# Patient Record
Sex: Female | Born: 2000 | Race: White | Hispanic: No | Marital: Single | Smoking: Never smoker
Health system: Southern US, Community
[De-identification: ages and names within clinical notes are randomized; demographics above are authoritative.]

## PROBLEM LIST (undated history)

## (undated) DIAGNOSIS — F909 Attention-deficit hyperactivity disorder, unspecified type: Secondary | ICD-10-CM

## (undated) DIAGNOSIS — F419 Anxiety disorder, unspecified: Secondary | ICD-10-CM

## (undated) HISTORY — PX: TONSILLECTOMY: SUR1361

---
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
## 2019-01-31 IMAGING — CT CT MAXILLOFACIAL W/O CM
4 of 6 series · 16 of 47 positions shown, 18 images · non-contrast
Comparison: None.

CLINICAL DATA: 16-year-old female with trauma.

EXAM:
CT HEAD WITHOUT CONTRAST
CT MAXILLOFACIAL WITHOUT CONTRAST
TECHNIQUE: Multidetector CT imaging of the head and maxillofacial structures
were performed using the standard protocol without intravenous
contrast. Multiplanar CT image reconstructions of the maxillofacial
structures were also generated.

[Series 3: head wo · axial · 0.40mm/px · z∈[-97,-2]mm · 6 of 27 slices shown, 8 images]
[im 4/27  brain]
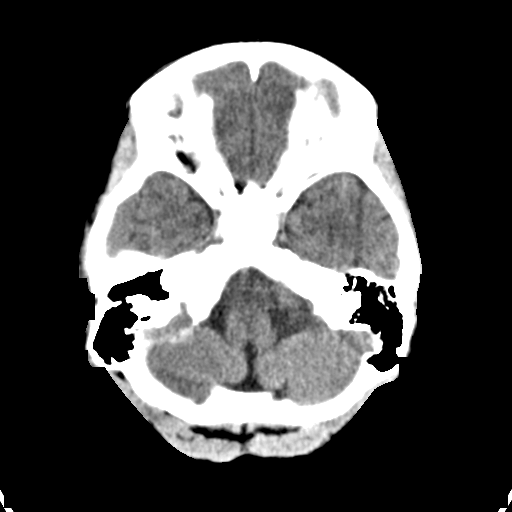
[im 4/27  bone]
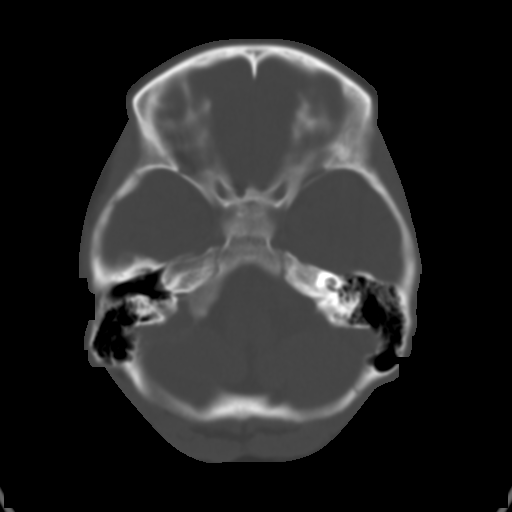
[im 8/27  bone]
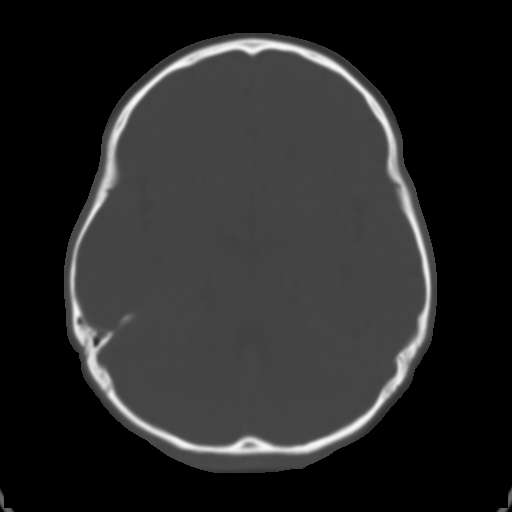
[im 12/27  bone]
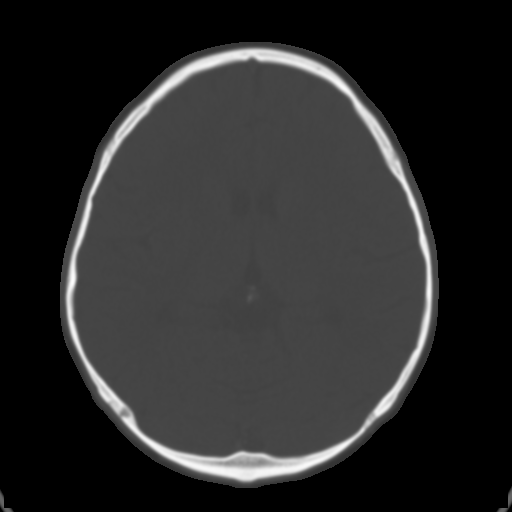
[im 15/27  bone]
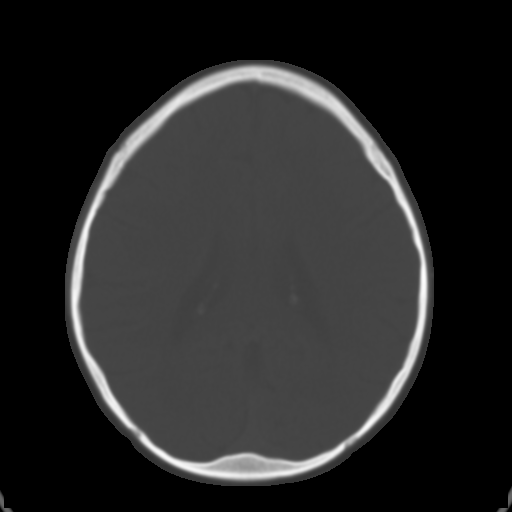
[im 19/27  brain]
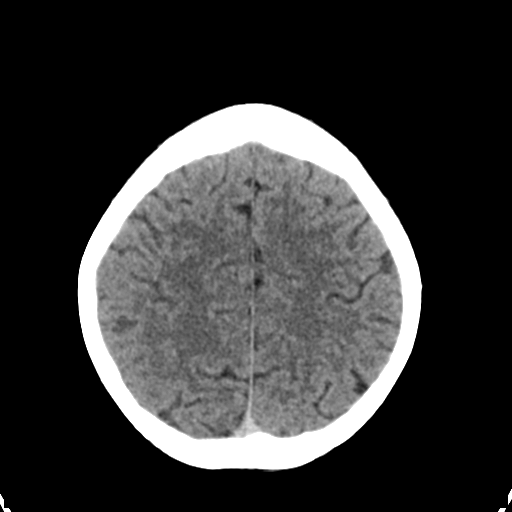
[im 19/27  bone]
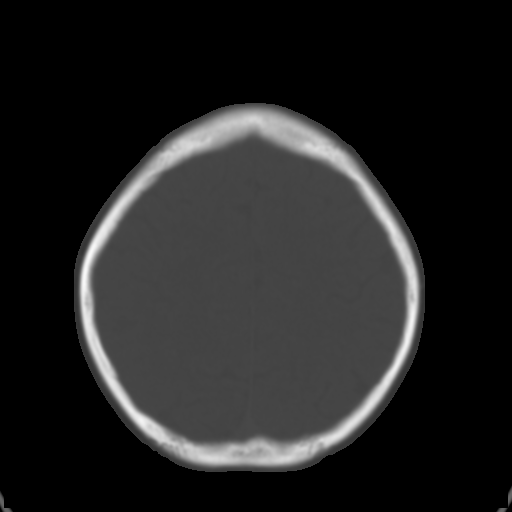
[im 23/27  bone]
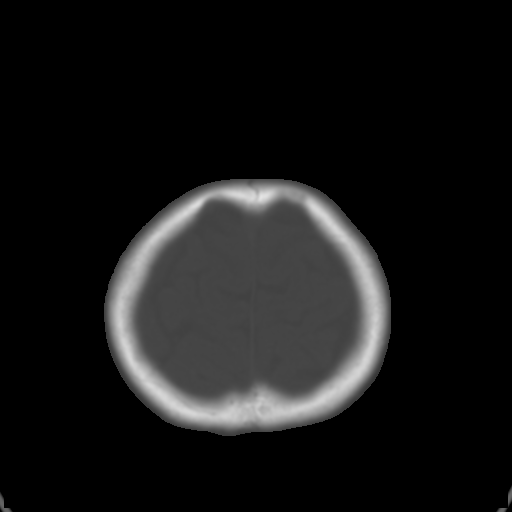

[Series 4: max soft · axial · 0.33mm/px · z∈[-215,-151]mm · 5 of 69 slices shown]
[im 7/69  brain]
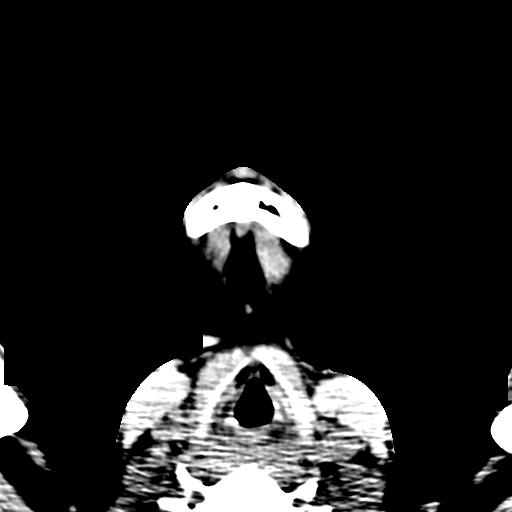
[im 13/69  brain]
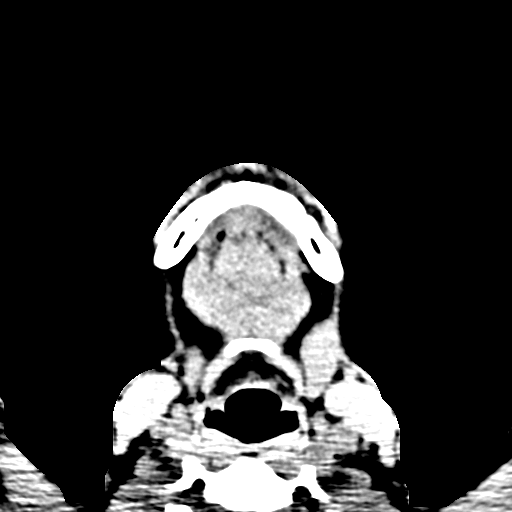
[im 23/69  brain]
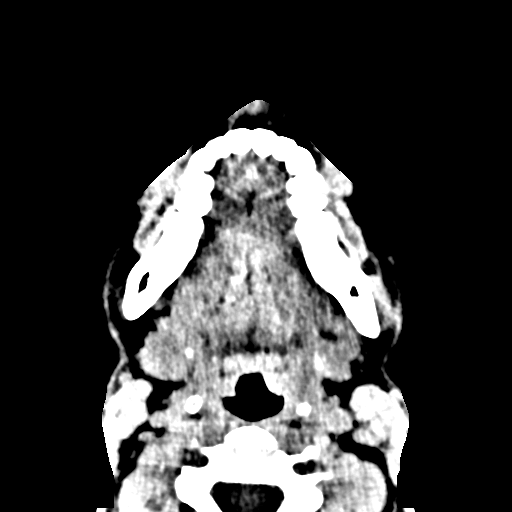
[im 30/69  brain]
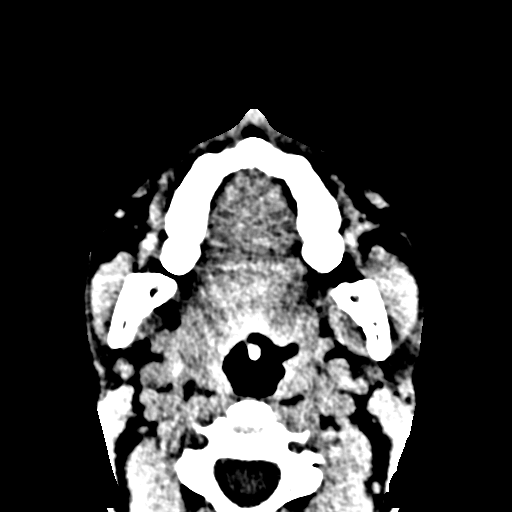
[im 39/69  brain]
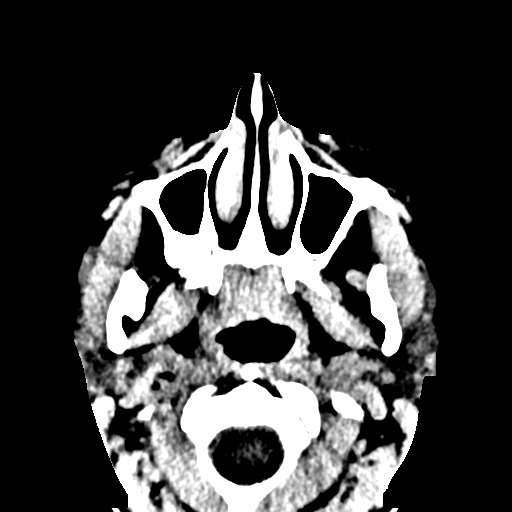

[Series 8: coronal soft tissue · coronal · 0.31mm/px · 3 of 60 slices shown]
[im 14/60  bone]
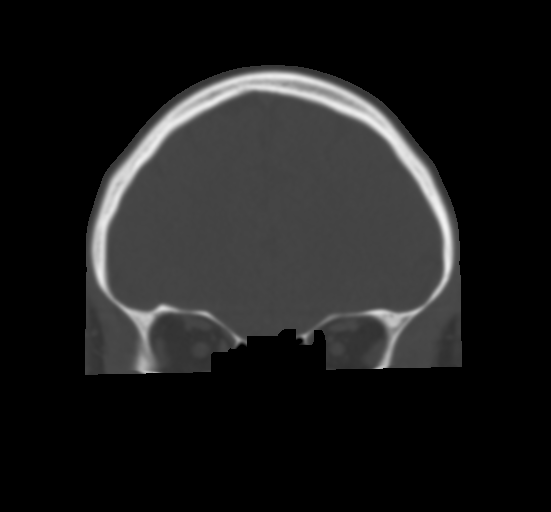
[im 20/60  bone]
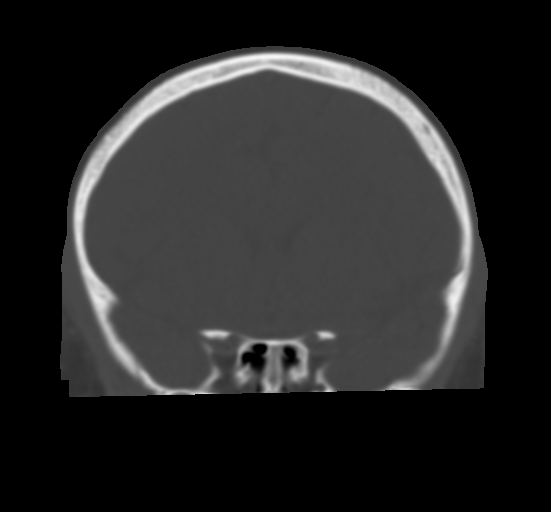
[im 27/60  bone]
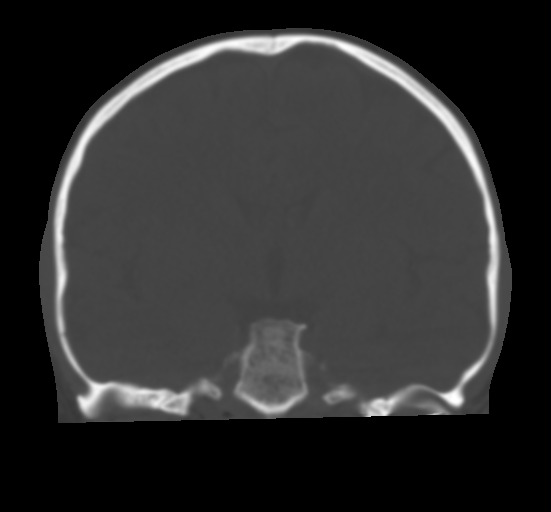

[Series 11: sagittal soft · sagittal · 0.28mm/px · 2 of 75 slices shown]
[im 25/75  bone]
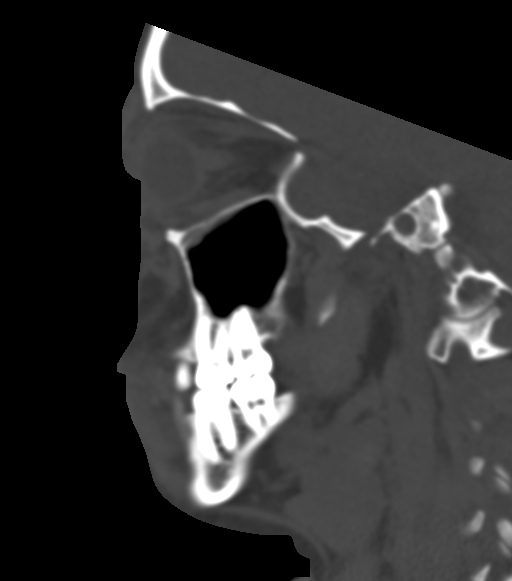
[im 50/75  bone]
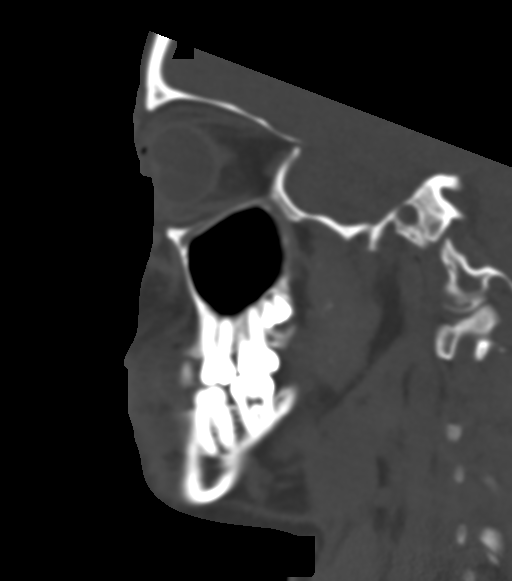

[16 of 47 positions shown; findings below may reference images not displayed]

FINDINGS: CT HEAD FINDINGS

Brain: No evidence of acute infarction, hemorrhage, hydrocephalus,
extra-axial collection or mass lesion/mass effect.

Vascular: No hyperdense vessel or unexpected calcification.

Skull: Normal. Negative for fracture or focal lesion.

Other: None.

CT MAXILLOFACIAL FINDINGS

Osseous: There is a fracture of the left orbital floor with
minimally depressed fragment. There is slight herniation of the
orbital fat through the orbital floor into the left maxillary
antrum. No other acute fracture identified. There is no dislocation.

Orbits: The globes and retro-orbital fat are preserved. There is
abutment of the left inferior rectus muscle to the orbital floor
fracture. Correlation with clinical exam is recommended to exclude
ocular entrapment.

Sinuses: Mild mucoperiosteal thickening of paranasal sinuses. No
air-fluid levels. The mastoid air cells are clear.

Soft tissues: Left periorbital hematoma.
IMPRESSION: 1. Normal unenhanced CT of the brain.
2. Minimally displaced fracture of the left orbital floor. Clinical
correlation is recommended to exclude ocular entrapment.

## 2019-06-11 ENCOUNTER — Other Ambulatory Visit: Payer: Self-pay | Admitting: *Deleted

## 2019-06-11 DIAGNOSIS — Z20822 Contact with and (suspected) exposure to covid-19: Secondary | ICD-10-CM

## 2019-06-12 LAB — NOVEL CORONAVIRUS, NAA: SARS-CoV-2, NAA: NOT DETECTED

## 2019-10-31 ENCOUNTER — Ambulatory Visit: Payer: PRIVATE HEALTH INSURANCE | Attending: Internal Medicine

## 2019-10-31 DIAGNOSIS — Z23 Encounter for immunization: Secondary | ICD-10-CM

## 2019-10-31 NOTE — Progress Notes (Signed)
   Covid-19 Vaccination Clinic  Name:  Rebekah Barajas    MRN: [id number] DOB: [birth year]  10/31/2019  [name] was observed post Covid-19 immunization for 15 minutes without incident. She was provided with Vaccine Information Sheet and instruction to access the V-Safe system.   [name] was instructed to call 911 with any severe reactions post vaccine: Marland Kitchen Difficulty breathing  . Swelling of face and throat  . A fast heartbeat  . A bad rash all over body  . Dizziness and weakness   Immunizations Administered    Name Date Dose VIS Date Route   Pfizer COVID-19 Vaccine 10/31/2019 12:36 PM 0.3 mL 07/16/2019 Intramuscular   Manufacturer: [employer], [employer]   Lot: [id number]   NDC: [address]-4

## 2019-11-28 ENCOUNTER — Ambulatory Visit: Payer: PRIVATE HEALTH INSURANCE

## 2019-11-29 ENCOUNTER — Ambulatory Visit: Payer: PRIVATE HEALTH INSURANCE | Attending: Internal Medicine

## 2019-11-29 DIAGNOSIS — Z23 Encounter for immunization: Secondary | ICD-10-CM

## 2019-11-29 NOTE — Progress Notes (Signed)
   [facility] Vaccination [facility]  Name:  Rebekah Barajas    MRN: [id number] DOB: [birth year]  11/29/2019  [name] was observed post Covid-19 immunization for 15 minutes without incident. She was provided with Vaccine Information Sheet and instruction to access the V-Safe system.   [name] was instructed to call 911 with any severe reactions post vaccine: Marland Kitchen Difficulty breathing  . Swelling of face and throat  . A fast heartbeat  . A bad rash all over body  . Dizziness and weakness   Immunizations Administered    Name Date Dose VIS Date Route   Pfizer COVID-19 Vaccine 11/29/2019 10:34 AM 0.3 mL 09/29/2018 Intramuscular   Manufacturer: [employer], [employer]   Lot: [id number]   NDC: [address]-7
